# Patient Record
Sex: Female | Born: 2000 | Race: Asian | Marital: Single | Smoking: Never smoker
Health system: Southern US, Community
[De-identification: ages and names within clinical notes are randomized; demographics above are authoritative.]

## PROBLEM LIST (undated history)

## (undated) DIAGNOSIS — F419 Anxiety disorder, unspecified: Secondary | ICD-10-CM

## (undated) DIAGNOSIS — N911 Secondary amenorrhea: Secondary | ICD-10-CM

## (undated) DIAGNOSIS — E039 Hypothyroidism, unspecified: Secondary | ICD-10-CM

## (undated) HISTORY — DX: Hypothyroidism, unspecified: E03.9

## (undated) HISTORY — PX: WISDOM TOOTH EXTRACTION: SHX21

## (undated) HISTORY — DX: Anxiety disorder, unspecified: F41.9

## (undated) HISTORY — DX: Secondary amenorrhea: N91.1

---
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
## 2019-03-18 DIAGNOSIS — Z20828 Contact with and (suspected) exposure to other viral communicable diseases: Secondary | ICD-10-CM | POA: Diagnosis not present

## 2019-05-10 DIAGNOSIS — F411 Generalized anxiety disorder: Secondary | ICD-10-CM | POA: Diagnosis not present

## 2019-05-14 DIAGNOSIS — F411 Generalized anxiety disorder: Secondary | ICD-10-CM | POA: Diagnosis not present

## 2019-05-28 DIAGNOSIS — F411 Generalized anxiety disorder: Secondary | ICD-10-CM | POA: Diagnosis not present

## 2019-05-30 ENCOUNTER — Telehealth: Payer: Self-pay

## 2019-05-30 ENCOUNTER — Other Ambulatory Visit: Payer: Self-pay | Admitting: Family

## 2019-05-30 DIAGNOSIS — F4322 Adjustment disorder with anxiety: Secondary | ICD-10-CM

## 2019-05-30 MED ORDER — ESCITALOPRAM OXALATE 10 MG PO TABS: 10.0000 mg | ORAL_TABLET | Freq: Every day | ORAL | 2 refills | Status: DC

## 2019-05-30 NOTE — Telephone Encounter (Signed)
Mother called asking for refill of Lexapro and synthroid to be sent to [facility]. She asks for 3 months supply to be sent to pharmacy.

## 2019-05-31 ENCOUNTER — Other Ambulatory Visit: Payer: Self-pay | Admitting: Family

## 2019-05-31 DIAGNOSIS — E039 Hypothyroidism, unspecified: Secondary | ICD-10-CM

## 2019-05-31 MED ORDER — LEVOTHYROXINE SODIUM 75 MCG PO TABS: ORAL_TABLET | ORAL | 0 refills | Status: DC

## 2019-05-31 NOTE — Telephone Encounter (Signed)
She also asks for Synthroid to be refilled as well.

## 2019-06-05 DIAGNOSIS — F411 Generalized anxiety disorder: Secondary | ICD-10-CM | POA: Diagnosis not present

## 2019-06-05 NOTE — Telephone Encounter (Signed)
Called and left vm stating medication was sent to pharmacy and pt needs follow up. Asked to call office back to schedule virtual visit.

## 2019-06-10 DIAGNOSIS — Z20828 Contact with and (suspected) exposure to other viral communicable diseases: Secondary | ICD-10-CM | POA: Diagnosis not present

## 2019-06-12 DIAGNOSIS — F411 Generalized anxiety disorder: Secondary | ICD-10-CM | POA: Diagnosis not present

## 2019-06-21 DIAGNOSIS — Z20828 Contact with and (suspected) exposure to other viral communicable diseases: Secondary | ICD-10-CM | POA: Diagnosis not present

## 2019-07-17 DIAGNOSIS — F411 Generalized anxiety disorder: Secondary | ICD-10-CM | POA: Diagnosis not present

## 2019-08-27 DIAGNOSIS — Z20828 Contact with and (suspected) exposure to other viral communicable diseases: Secondary | ICD-10-CM | POA: Diagnosis not present

## 2019-09-27 ENCOUNTER — Ambulatory Visit: Payer: PRIVATE HEALTH INSURANCE | Attending: Internal Medicine

## 2019-09-27 DIAGNOSIS — Z20828 Contact with and (suspected) exposure to other viral communicable diseases: Secondary | ICD-10-CM | POA: Diagnosis not present

## 2019-09-27 DIAGNOSIS — Z20822 Contact with and (suspected) exposure to covid-19: Secondary | ICD-10-CM

## 2019-09-28 DIAGNOSIS — E559 Vitamin D deficiency, unspecified: Secondary | ICD-10-CM

## 2019-09-28 HISTORY — DX: Vitamin D deficiency, unspecified: E55.9

## 2019-09-29 LAB — NOVEL CORONAVIRUS, NAA: SARS-CoV-2, NAA: NOT DETECTED

## 2019-11-19 ENCOUNTER — Other Ambulatory Visit: Payer: Self-pay | Admitting: Pediatrics

## 2019-11-19 DIAGNOSIS — E039 Hypothyroidism, unspecified: Secondary | ICD-10-CM

## 2019-11-26 ENCOUNTER — Ambulatory Visit: Payer: PRIVATE HEALTH INSURANCE | Admitting: Pediatrics

## 2019-12-21 ENCOUNTER — Telehealth: Payer: Self-pay | Admitting: Pediatrics

## 2019-12-21 NOTE — Telephone Encounter (Signed)
Pre-screening for onsite visit  Patient  Informed only one adult can bring patient to the visit to limit possible exposure to COVID19 and facemasks must be worn while in the building by the patient (ages 2 and older) and adult.  2. Has the person bringing the patient or the patient been around anyone with suspected or confirmed COVID-19 in the last 14 days? No  3. Has the person bringing the patient or the patient been around anyone who has been tested for COVID-19 in the last 14 days? No  4. Has the person bringing the patient or the patient had any of these symptoms in the last 14 days? No  Fever (temp 100 F or higher) Breathing problems Cough Sore throat Body aches Chills Vomiting Diarrhea Loss of taste or smell   If all answers are negative, advise patient to call our office prior to your appointment if you or the patient develop any of the symptoms listed above.   If any answers are yes, cancel in-office visit and schedule the patient for a same day telehealth visit with a provider to discuss the next steps.

## 2019-12-24 ENCOUNTER — Other Ambulatory Visit: Payer: Self-pay

## 2019-12-24 ENCOUNTER — Ambulatory Visit (INDEPENDENT_AMBULATORY_CARE_PROVIDER_SITE_OTHER): Payer: PRIVATE HEALTH INSURANCE | Admitting: Pediatrics

## 2019-12-24 ENCOUNTER — Encounter: Payer: Self-pay | Admitting: Pediatrics

## 2019-12-24 VITALS — BP 119/67 | HR 81 | Ht 62.5 in | Wt 142.0 lb

## 2019-12-24 DIAGNOSIS — F5001 Anorexia nervosa, restricting type: Secondary | ICD-10-CM | POA: Diagnosis not present

## 2019-12-24 DIAGNOSIS — R5383 Other fatigue: Secondary | ICD-10-CM

## 2019-12-24 DIAGNOSIS — F50019 Anorexia nervosa, restricting type, unspecified: Secondary | ICD-10-CM

## 2019-12-24 DIAGNOSIS — E039 Hypothyroidism, unspecified: Secondary | ICD-10-CM | POA: Diagnosis not present

## 2019-12-24 DIAGNOSIS — F4322 Adjustment disorder with anxiety: Secondary | ICD-10-CM

## 2019-12-24 MED ORDER — LEVOTHYROXINE SODIUM 50 MCG PO TABS: 50.0000 ug | ORAL_TABLET | Freq: Every day | ORAL | 1 refills | Status: DC

## 2019-12-24 NOTE — Patient Instructions (Signed)
Synthroid 50 mcg daily  Labs today

## 2019-12-24 NOTE — Progress Notes (Signed)
History was provided by the patient.  Mary Ellis is a 19 y.o. female who is here for fatigue, excessive sleeping.  Patsi Sears, MD (Inactive)   HPI:  Pt reports that she had CVOID in the beginning of February. Feels like fatigue was bad before that, doesn't seem to have worsened. Smell hasn't returned totally. She is sleeping during the day and then needing to go back to bed early in the evening.   She goes [place] lots of walks in [place].   Food intake is good.   Lexapro continues to go well. Denies issues with anxiety or depression symptoms except for fatigue, which is likely r/t her thyroid.   Took ocp 1/2 freshman year and then stopped. Not sexually active. Never been. Menstrual cycles are pretty normal. They usually last about 4 days, but last one in March was about 10 days. Denies hair loss.   Doesn't eat red meat, otherwise diet is really varied.    No LMP recorded.  Review of Systems  Constitutional: Positive for malaise/fatigue.  Eyes: Negative for double vision.  Respiratory: Negative for shortness of breath.   Cardiovascular: Negative for chest pain and palpitations.  Gastrointestinal: Negative for abdominal pain, constipation, diarrhea, nausea and vomiting.  Genitourinary: Negative for dysuria.  Musculoskeletal: Negative for joint pain and myalgias.  Skin: Negative for rash.  Neurological: Negative for dizziness and headaches.  Endo/Heme/Allergies: Does not bruise/bleed easily.  Psychiatric/Behavioral: Negative for depression. The patient is not nervous/anxious and does not have insomnia.     Patient Active Problem List   Diagnosis Date Noted  . Adjustment disorder with anxiety 12/01/2016  . Esophageal reflux 07/06/2016  . Constipation 06/29/2016  . Hypothyroidism 06/18/2016  . Disordered eating 06/14/2016  . Amenorrhea, secondary 06/14/2016    Current Outpatient Medications on File Prior to Visit  Medication Sig Dispense Refill  . escitalopram (LEXAPRO) 10  MG tablet Take 1 tablet (10 mg total) by mouth daily. 90 tablet 2  . levothyroxine (SYNTHROID) 75 MCG tablet TAKE 1/2 TABLET BY MOUTH EVERY DAY BEFORE BREAKFAST 90 tablet 0  . Multiple Vitamin (MULTI-VITAMIN PO) Take by mouth.    . omega-3 acid ethyl esters (LOVAZA) 1 g capsule Take by mouth 2 (two) times daily.    . Probiotic Product (PROBIOTIC PO) Take 1 tablet by mouth daily.     No current facility-administered medications on file prior to visit.    Allergies  Allergen Reactions  . Lactose Intolerance (Gi)   . Pineapple Hives    Social History: Confidentiality was discussed with the patient and if applicable, with caregiver as well. Tobacco: none Secondhand smoke exposure? no Drugs/EtOH: none Sexually active? no  Safety: safe to self and at school Pregnancy Prevention: condoms  Physical Exam:    Vitals:   12/24/19 1510  BP: 119/67  Pulse: 81  Weight: 142 lb (64.4 kg)  Height: 5' 2.5" (1.588 m)    Blood pressure percentiles are not available for patients who are 18 years or older.  Physical Exam Vitals and nursing note reviewed.  Constitutional:      General: She is not in acute distress.    Appearance: She is well-developed.  Neck:     Thyroid: No thyroid mass, thyromegaly or thyroid tenderness.  Cardiovascular:     Rate and Rhythm: Normal rate and regular rhythm.     Heart sounds: No murmur.  Pulmonary:     Breath sounds: Normal breath sounds.  Abdominal:     Palpations: Abdomen is soft. There  is no mass.     Tenderness: There is no abdominal tenderness. There is no guarding.  Musculoskeletal:     Right lower leg: No edema.     Left lower leg: No edema.  Lymphadenopathy:     Cervical: No cervical adenopathy.  Skin:    General: Skin is warm.     Findings: No rash.  Neurological:     Mental Status: She is alert.     Comments: No tremor     Assessment/Plan: 1. Hypothyroidism, unspecified type TSH on labs at [facility] was 4.97. we reviewed these  together from her mychart. We will increase synthroid dose to 50 mcg daily and repeat labs in 6 weeks there. She will send me results.  - levothyroxine (SYNTHROID) 50 MCG tablet; Take 1 tablet (50 mcg total) by mouth daily.  Dispense: 90 tablet; Refill: 1 - Thyroid Peroxidase Antibodies (TPO) (REFL) - TSH + free T4  2. Adjustment disorder with anxiety Stable on lexapro.   3. Anorexia nervosa, restricting type In remission.   4. Fatigue, unspecified type Will ensure she doesn't have any vit deficiencies contributing to fatigue.  - Vitamin D (25 hydroxy) - B12 - Ferritin   Alfonso Ramus, FNP

## 2019-12-25 ENCOUNTER — Other Ambulatory Visit: Payer: Self-pay | Admitting: Pediatrics

## 2019-12-25 DIAGNOSIS — E559 Vitamin D deficiency, unspecified: Secondary | ICD-10-CM

## 2019-12-25 LAB — VITAMIN D 25 HYDROXY (VIT D DEFICIENCY, FRACTURES): Vit D, 25-Hydroxy: 11 ng/mL — ABNORMAL LOW (ref 30–100)

## 2019-12-25 LAB — THYROID PEROXIDASE ANTIBODIES (TPO) (REFL): Thyroperoxidase Ab SerPl-aCnc: <1 [IU]/mL

## 2019-12-25 LAB — FERRITIN: Ferritin: 51 ng/mL (ref 16–154)

## 2019-12-25 LAB — VITAMIN B12: Vitamin B-12: 576 pg/mL (ref 200–1100)

## 2019-12-25 MED ORDER — VITAMIN D (ERGOCALCIFEROL) 1.25 MG (50000 UNIT) PO CAPS: 50000.0000 [IU] | ORAL_CAPSULE | ORAL | 0 refills | Status: DC

## 2019-12-27 ENCOUNTER — Ambulatory Visit: Payer: PRIVATE HEALTH INSURANCE | Admitting: Family

## 2020-02-11 ENCOUNTER — Other Ambulatory Visit: Payer: Self-pay | Admitting: Family

## 2020-02-11 DIAGNOSIS — F4322 Adjustment disorder with anxiety: Secondary | ICD-10-CM

## 2020-03-25 ENCOUNTER — Other Ambulatory Visit: Payer: Self-pay | Admitting: Pediatrics

## 2020-03-25 DIAGNOSIS — E559 Vitamin D deficiency, unspecified: Secondary | ICD-10-CM

## 2020-05-03 ENCOUNTER — Other Ambulatory Visit: Payer: Self-pay | Admitting: Pediatrics

## 2020-05-03 ENCOUNTER — Other Ambulatory Visit: Payer: Self-pay | Admitting: Family

## 2020-05-03 DIAGNOSIS — E039 Hypothyroidism, unspecified: Secondary | ICD-10-CM

## 2020-05-03 DIAGNOSIS — F4322 Adjustment disorder with anxiety: Secondary | ICD-10-CM

## 2020-05-05 ENCOUNTER — Other Ambulatory Visit: Payer: Self-pay | Admitting: Pediatrics

## 2020-05-05 DIAGNOSIS — E039 Hypothyroidism, unspecified: Secondary | ICD-10-CM

## 2020-05-05 MED ORDER — LEVOTHYROXINE SODIUM 50 MCG PO TABS: 50.0000 ug | ORAL_TABLET | Freq: Every day | ORAL | 1 refills | Status: DC

## 2020-05-08 ENCOUNTER — Ambulatory Visit (INDEPENDENT_AMBULATORY_CARE_PROVIDER_SITE_OTHER): Payer: BC Managed Care – PPO | Admitting: Pediatrics

## 2020-05-08 DIAGNOSIS — E039 Hypothyroidism, unspecified: Secondary | ICD-10-CM

## 2020-05-08 DIAGNOSIS — R7989 Other specified abnormal findings of blood chemistry: Secondary | ICD-10-CM | POA: Diagnosis not present

## 2020-05-08 LAB — VITAMIN D 25 HYDROXY (VIT D DEFICIENCY, FRACTURES): Vit D, 25-Hydroxy: 44 ng/mL (ref 30–100)

## 2020-05-08 LAB — TSH: TSH: 2.16 mIU/L

## 2020-05-08 LAB — T4, FREE: Free T4: 1.1 ng/dL (ref 0.8–1.4)

## 2020-05-08 NOTE — Progress Notes (Signed)
In for labs. Blood collected without incident. She is doing well although feeling somewhat more fatigued recently. TFTs and vit D drawn.   Alfonso Ramus, FNP

## 2020-06-26 ENCOUNTER — Other Ambulatory Visit: Payer: Self-pay | Admitting: Pediatrics

## 2020-06-26 DIAGNOSIS — E039 Hypothyroidism, unspecified: Secondary | ICD-10-CM

## 2020-06-26 MED ORDER — LEVOTHYROXINE SODIUM 50 MCG PO TABS: 50.0000 ug | ORAL_TABLET | Freq: Every day | ORAL | 1 refills | Status: DC

## 2020-07-28 ENCOUNTER — Other Ambulatory Visit: Payer: Self-pay | Admitting: Family

## 2020-07-28 DIAGNOSIS — F4322 Adjustment disorder with anxiety: Secondary | ICD-10-CM

## 2020-09-29 ENCOUNTER — Other Ambulatory Visit: Payer: Self-pay | Admitting: Family

## 2020-09-29 DIAGNOSIS — F4322 Adjustment disorder with anxiety: Secondary | ICD-10-CM

## 2020-10-21 ENCOUNTER — Other Ambulatory Visit: Payer: Self-pay | Admitting: Pediatrics

## 2020-10-21 ENCOUNTER — Telehealth: Payer: Self-pay

## 2020-10-21 DIAGNOSIS — E039 Hypothyroidism, unspecified: Secondary | ICD-10-CM

## 2020-10-21 NOTE — Telephone Encounter (Signed)
Faxed over to clinic.

## 2020-10-21 NOTE — Telephone Encounter (Signed)
-----   Message from Verneda Skill, FNP sent at 10/21/2020 11:55 AM EST ----- Regarding: FW: Labs I will place orders, can you fax  ----- Message ----- From: Irish Lack, MD Sent: 10/21/2020  11:35 AM EST To: Verneda Skill, FNP Subject: Labs                                           Hi Francisca December was wondering if you could fax in a TSH order to [facility]. She is feeling very fatigued and thinks her Synthroid dose may need to be increased. Their fax is [phone] and main phone is [phone].  Thank you so much! Irish Lack

## 2020-10-30 ENCOUNTER — Encounter: Payer: Self-pay | Admitting: Pediatrics

## 2020-10-31 ENCOUNTER — Other Ambulatory Visit: Payer: Self-pay | Admitting: Pediatrics

## 2020-10-31 MED ORDER — LEVOTHYROXINE SODIUM 75 MCG PO TABS: 75.0000 ug | ORAL_TABLET | Freq: Every day | ORAL | 1 refills | Status: DC

## 2021-04-12 DIAGNOSIS — E559 Vitamin D deficiency, unspecified: Secondary | ICD-10-CM

## 2021-04-12 DIAGNOSIS — R5383 Other fatigue: Secondary | ICD-10-CM

## 2021-04-12 DIAGNOSIS — E039 Hypothyroidism, unspecified: Secondary | ICD-10-CM

## 2021-04-13 ENCOUNTER — Other Ambulatory Visit: Payer: Self-pay | Admitting: Family

## 2021-04-13 ENCOUNTER — Other Ambulatory Visit: Payer: Self-pay | Admitting: Pediatrics

## 2021-04-13 DIAGNOSIS — F4322 Adjustment disorder with anxiety: Secondary | ICD-10-CM

## 2021-04-13 MED ORDER — ESCITALOPRAM OXALATE 10 MG PO TABS: 10.0000 mg | ORAL_TABLET | Freq: Every day | ORAL | 0 refills | Status: DC

## 2021-04-13 MED ORDER — NORGESTIMATE-ETH ESTRADIOL 0.25-35 MG-MCG PO TABS: 1.0000 | ORAL_TABLET | Freq: Every day | ORAL | 3 refills | Status: DC

## 2021-04-21 ENCOUNTER — Other Ambulatory Visit: Payer: Self-pay | Admitting: Pediatrics

## 2021-04-21 DIAGNOSIS — E559 Vitamin D deficiency, unspecified: Secondary | ICD-10-CM

## 2021-04-21 DIAGNOSIS — R5383 Other fatigue: Secondary | ICD-10-CM

## 2021-04-21 DIAGNOSIS — E039 Hypothyroidism, unspecified: Secondary | ICD-10-CM

## 2021-04-27 ENCOUNTER — Other Ambulatory Visit: Payer: Self-pay | Admitting: Pediatrics

## 2021-04-27 MED ORDER — LEVOTHYROXINE SODIUM 88 MCG PO TABS: 88.0000 ug | ORAL_TABLET | Freq: Every day | ORAL | 1 refills | Status: DC

## 2021-05-27 ENCOUNTER — Other Ambulatory Visit: Payer: Self-pay | Admitting: Pediatrics

## 2021-05-27 MED ORDER — ETHYNODIOL DIAC-ETH ESTRADIOL 1-35 MG-MCG PO TABS: 1.0000 | ORAL_TABLET | Freq: Every day | ORAL | 3 refills | Status: DC

## 2021-06-11 ENCOUNTER — Other Ambulatory Visit: Payer: Self-pay | Admitting: Pediatrics

## 2021-06-11 DIAGNOSIS — F4322 Adjustment disorder with anxiety: Secondary | ICD-10-CM

## 2021-06-15 ENCOUNTER — Other Ambulatory Visit: Payer: Self-pay

## 2021-06-15 DIAGNOSIS — E039 Hypothyroidism, unspecified: Secondary | ICD-10-CM

## 2021-06-19 ENCOUNTER — Ambulatory Visit: Payer: BC Managed Care – PPO

## 2021-06-19 DIAGNOSIS — E039 Hypothyroidism, unspecified: Secondary | ICD-10-CM | POA: Diagnosis not present

## 2021-06-19 LAB — TSH+FREE T4: TSH W/REFLEX TO FT4: 1.23 mIU/L

## 2021-11-14 ENCOUNTER — Other Ambulatory Visit: Payer: Self-pay | Admitting: Pediatrics

## 2021-12-31 ENCOUNTER — Ambulatory Visit (INDEPENDENT_AMBULATORY_CARE_PROVIDER_SITE_OTHER): Payer: BC Managed Care – PPO | Admitting: Pediatrics

## 2021-12-31 ENCOUNTER — Other Ambulatory Visit (HOSPITAL_COMMUNITY)
Admission: RE | Admit: 2021-12-31 | Discharge: 2021-12-31 | Disposition: A | Discharge status: Home / Self Care | Payer: BC Managed Care – PPO | Source: Ambulatory Visit | Attending: Pediatrics | Admitting: Pediatrics

## 2021-12-31 ENCOUNTER — Encounter: Payer: Self-pay | Admitting: Pediatrics

## 2021-12-31 VITALS — BP 113/74 | HR 82 | Ht 62.6 in | Wt 131.2 lb

## 2021-12-31 DIAGNOSIS — Z113 Encounter for screening for infections with a predominantly sexual mode of transmission: Secondary | ICD-10-CM

## 2021-12-31 DIAGNOSIS — F4322 Adjustment disorder with anxiety: Secondary | ICD-10-CM | POA: Diagnosis not present

## 2021-12-31 DIAGNOSIS — K219 Gastro-esophageal reflux disease without esophagitis: Secondary | ICD-10-CM

## 2021-12-31 DIAGNOSIS — E039 Hypothyroidism, unspecified: Secondary | ICD-10-CM | POA: Diagnosis not present

## 2021-12-31 DIAGNOSIS — R140 Abdominal distension (gaseous): Secondary | ICD-10-CM | POA: Diagnosis not present

## 2021-12-31 DIAGNOSIS — Z3202 Encounter for pregnancy test, result negative: Secondary | ICD-10-CM

## 2021-12-31 DIAGNOSIS — E559 Vitamin D deficiency, unspecified: Secondary | ICD-10-CM | POA: Diagnosis not present

## 2021-12-31 LAB — T3: T3, Total: 111 ng/dL (ref 76–181)

## 2021-12-31 LAB — T4, FREE: Free T4: 1.6 ng/dL (ref 0.8–1.8)

## 2021-12-31 LAB — VITAMIN D 25 HYDROXY (VIT D DEFICIENCY, FRACTURES): Vit D, 25-Hydroxy: 35 ng/mL (ref 30–100)

## 2021-12-31 LAB — TSH: TSH: 1.93 mIU/L

## 2021-12-31 MED ORDER — FAMOTIDINE 40 MG PO TABS: 40.0000 mg | ORAL_TABLET | Freq: Every day | ORAL | 2 refills | Status: DC

## 2021-12-31 NOTE — Patient Instructions (Signed)
Start probiotics for your GI tract  ?Famotidone for reflux  ?Labs today  ?Work on more exercise  ? ?

## 2021-12-31 NOTE — Progress Notes (Signed)
History was provided by the patient. ? ?Mary Ellis is a 21 y.o. female who is here for adjustment disorder, hypothyroidism.  ?Pcp, No  ? ?HPI:  Pt reports she is graduating this year. 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  ? ?Has been really tired lately. Sleeps 12-14 hours a day- says she never goes outside. Not a lot of motivation to do anything. Doesn't really feel sad or anxious. Does wonder if more depressed. Started more at the end of last semester and feels like it is worsening. Taking lexapro regularly. She is sleeping through the night.  ? ?Eating is very normal. Not getting any physical activity. Would like to start going back to yoga- thinks one of her friends would be willing to go with her to help hold her accountable.  ? ?Not having any constipation, diarrhea, cold intolerance. Is having some ongoing reflux that has been present since she was a child. Also having some bloating after some meals for the past 2-3 years. Has needed a lot of antibiotics for strep in the last year as well.  ? ? ?  12/31/2021  ? 11:17 AM 12/20/2017  ?  3:42 PM 12/20/2017  ?  3:41 PM  ?PHQ-SADS Last 3 Score only  ?PHQ-15 Score 9 4   ?Total GAD-7 Score 1  3  ?PHQ Adolescent Score 9  3  ?  ? ? ?No LMP recorded. ? ?ROS ? ?Patient Active Problem List  ? Diagnosis Date Noted  ? Adjustment disorder with anxiety 12/01/2016  ? Esophageal reflux 07/06/2016  ? Constipation 06/29/2016  ? Hypothyroidism 06/18/2016  ? Disordered eating 06/14/2016  ? Amenorrhea, secondary 06/14/2016  ? ? ?Current Outpatient Medications on File Prior to Visit  ?Medication Sig Dispense Refill  ? escitalopram (LEXAPRO) 10 MG tablet TAKE 1 TABLET BY MOUTH EVERY DAY 90 tablet 1  ? levothyroxine (SYNTHROID) 88 MCG tablet TAKE 1 TABLET BY MOUTH EVERY DAY 30 tablet 5  ? Vitamin D, Ergocalciferol, (DRISDOL) 1.25 MG (50000 UNIT) CAPS capsule TAKE 1 CAPSULE (50,000 UNITS TOTAL) BY MOUTH EVERY 7 (SEVEN) DAYS. 4 capsule 2  ? norgestimate-ethinyl  estradiol (ORTHO-CYCLEN) 0.25-35 MG-MCG tablet Take 1 tablet by mouth daily.    ? ?No current facility-administered medications on file prior to visit.  ? ? ?Allergies  ?Allergen Reactions  ? Lactose Intolerance (Gi)   ? Pineapple Hives  ? ? ?Social History: ?Confidentiality was discussed with the patient and if applicable, with caregiver as well. ?Tobacco: vape socially  ?Secondhand smoke exposure? no ?Drugs/EtOH: drinking socially  ?Sexually active? yes - using condoms sometimes. Taking ocp every day. Having periods once a month. Last period was more like 10 days but not a heavy flow.   ?Safety: yes ? ?Physical Exam:  ?  ?Vitals:  ? 12/31/21 1035  ?BP: 113/74  ?Pulse: 82  ?Weight: 131 lb 3.2 oz (59.5 kg)  ?Height: 5' 2.6" (1.59 m)  ? ? ?Growth percentile SmartLinks can only be used for patients less than 10 years old. ? ?Physical Exam ?Vitals and nursing note reviewed.  ?Constitutional:   ?   General: She is not in acute distress. ?   Appearance: She is well-developed.  ?Neck:  ?   Thyroid: No thyromegaly.  ?Cardiovascular:  ?   Rate and Rhythm: Normal rate and regular rhythm.  ?   Heart sounds: No murmur heard. ?Pulmonary:  ?   Breath sounds: Normal breath sounds.  ?Abdominal:  ?   Palpations: Abdomen is soft.  There is no mass.  ?   Tenderness: There is no abdominal tenderness. There is no guarding.  ?Musculoskeletal:  ?   Right lower leg: No edema.  ?   Left lower leg: No edema.  ?Lymphadenopathy:  ?   Cervical: No cervical adenopathy.  ?Skin: ?   General: Skin is warm.  ?   Capillary Refill: Capillary refill takes less than 2 seconds.  ?   Findings: No rash.  ?Neurological:  ?   General: No focal deficit present.  ?   Mental Status: She is alert.  ?   Comments: No tremor  ?Psychiatric:     ?   Mood and Affect: Mood normal.  ? ? ?Assessment/Plan: ?1. Hypothyroidism, unspecified type ?Repeat labs today. Has been taking synthroid 88 mcg daily and not missing doses.  ?- TSH ?- T4, free ?- T3 ? ?2. Adjustment  disorder with anxiety ?Not super anxious but definitely having an uptick in what may be depression type symtpoms. Will check thyroid today, discussed management options including increase in lexapro, addition of wellbutrin or trying exercise first. We will decide after labs.  ? ?3. Gastroesophageal reflux disease, unspecified whether esophagitis present ?Start pepcid as needed  ?- famotidine (PEPCID) 40 MG tablet; Take 1 tablet (40 mg total) by mouth daily.  Dispense: 30 tablet; Refill: 2 ? ?4. Abdominal bloating ?Start probiotic daily, monitor what foods may be contributing. ? ?5. Vitamin D deficiency ?Recheck today, takes supplement intermittently.  ?- VITAMIN D 25 Hydroxy (Vit-D Deficiency, Fractures) ? ?Return pending labs, as needed  ? ?Jonathon Resides, FNP ? ? ?

## 2021-12-31 NOTE — Addendum Note (Signed)
Addended by: Ardeth Sportsman on: 12/31/2021 11:43 AM ? ? Modules accepted: Orders ? ?

## 2022-01-01 LAB — URINE CYTOLOGY ANCILLARY ONLY
Bacterial Vaginitis-Urine: NEGATIVE
Candida Urine: NEGATIVE
Chlamydia: NEGATIVE
Comment: NEGATIVE
Comment: NEGATIVE
Comment: NORMAL
Neisseria Gonorrhea: NEGATIVE
Trichomonas: NEGATIVE

## 2022-03-10 ENCOUNTER — Other Ambulatory Visit: Payer: Self-pay | Admitting: Pediatrics

## 2022-03-10 MED ORDER — NORGESTIMATE-ETH ESTRADIOL 0.25-35 MG-MCG PO TABS: 1.0000 | ORAL_TABLET | Freq: Every day | ORAL | 3 refills | Status: DC

## 2022-03-18 ENCOUNTER — Other Ambulatory Visit: Payer: Self-pay | Admitting: Family

## 2022-03-18 DIAGNOSIS — F4322 Adjustment disorder with anxiety: Secondary | ICD-10-CM

## 2022-06-20 ENCOUNTER — Other Ambulatory Visit: Payer: Self-pay | Admitting: Family

## 2022-06-20 DIAGNOSIS — F4322 Adjustment disorder with anxiety: Secondary | ICD-10-CM

## 2022-06-25 ENCOUNTER — Other Ambulatory Visit: Payer: Self-pay | Admitting: Family Medicine

## 2022-06-25 MED ORDER — LEVOTHYROXINE SODIUM 88 MCG PO TABS: 88.0000 ug | ORAL_TABLET | Freq: Every day | ORAL | 0 refills | Status: DC

## 2022-06-25 NOTE — Progress Notes (Signed)
Received message from mother, asking if levothyroxine could be refilled for a month, to last until her visit.  Confirmed that she takes generic, rx sent.

## 2022-07-13 ENCOUNTER — Encounter: Payer: Self-pay | Admitting: Family Medicine

## 2022-07-13 DIAGNOSIS — E559 Vitamin D deficiency, unspecified: Secondary | ICD-10-CM | POA: Insufficient documentation

## 2022-07-13 NOTE — Progress Notes (Signed)
Chief Complaint  Patient presents with   Annual Exam    Fasting annual exam with pap. Sees eye doctor regularly. Has severe bloating and abdominal pain after she eats, has been going on as long as she can remember. She does try to eat less gluten and dairy which helps some.    Mary Ellis is a 21 y.o. female who presents for a complete physical.  She is a new patient, having previously seen only pediatrician. Last seen in 12/2021. She is due for bloodwork for her thyroid, and needing medication refills  She has the following concerns: Bloating and GI issues. With "everything I eat" she feels bloated, "like 9 months pregnant". She gets reflux if overeats. She reports these symptoms have been ongoing since childhood, no dramatic changes. Limits gluten and dairy, which helps. Smaller portions also help  She is in a sexual relationship, on OCP's, not using condoms. Going through a breakup currently.  Not handling it well, but has a lot of supportive friends, family and coworkers. (Related to being long distance relationship; denies concerns about infidelity).  She is on OCP's.  She had these refilled by prior provider in 02/2022 for a year. She has never had a pap smear. Menses are regular. She denies vaginal discharge, odor, itch. # lifetime sexual partners: 7 Had STD testing at [facility] 1.5 years ago. She had a vaginal laceration related to intercourse the spring of her junior year (related to dryness/lack of foreplay.  Denies abuse/rape, was consensual. She recalls being told she lost a lot of blood due to the laceration. She hasn't had any further GYN issues.  Anxiety:  She was started on Lexapro back in 2018 (related to significant anxiety and ongoing binge eating, after hospitalization for eating d/o). She has remained on 10mg  lexapro, and doing well. Denies anxiety since HS. She had a lot of stress senior year of college. More recently has had some depression, rather than  anxiety. At April PCP visit she had some depressive symptoms. She had lack of motivation, hadn't been exercising (stressful senior year). She reports that her moods improved with move, job, routine. Feeling down recently just related to relationship breakup. She has supportive friends/family Enjoys painting, would like to do more  GERD:  She was prescribed famotidine 40mg  in April. Doesn't take it.  Reflux has improved.  Vitamin D deficiency: level was low at 11 in 11/2019.  Last checked in April, level was 35 when taking supplement only intermittently. She has not been taking vitamin D recently, but had a lot of sun exposure over the summer.  Hypothyroidism:  She reports compliance with taking levothyroxine 24mcg daily. She takes this along with her OCP and lexapro, as she has always done. Takes 30 minutes before eating. She denies changes to hair/skin/nails/bowel/energy/moods (other than related to breakup).  Lab Results  Component Value Date   TSH 1.93 12/31/2021   H/o eating disorder--age 35, anorexia, hospitalized 05/2016 with 30# weight loss, bradycardia. No issues since. Comfortable with her weight, body image.   Immunization History  Administered Date(s) Administered   DTaP 05/09/2000, 07/11/2000, 09/05/2000, 06/01/2001, 04/08/2005   H1N1 08/02/2008, 08/30/2008   HIB (PRP-T) 05/09/2000, 07/11/2000, 09/05/2000, 06/01/2001   HPV Quadrivalent 08/16/2011, 10/27/2011, 04/27/2012   Hepatitis A, Ped/Adol-2 Dose 04/08/2005, 10/14/2005   Hepatitis B 12-Dec-1999, 04/07/2000, 12/05/2000   IPV 05/09/2000, 07/11/2000, 04/06/2001, 04/08/2005   Influenza Inj Mdck Quad Pf 07/30/2017   Influenza Nasal 07/16/2005, 09/11/2006, 09/07/2007, 07/17/2009, 07/28/2010, 08/16/2011   Influenza Split 09/10/2004  Influenza, Seasonal, Injecte, Preservative Fre 08/02/2008   Influenza,Quad,Nasal, Live 07/22/2012, 07/07/2013, 08/18/2014   Influenza,inj,Quad PF,6+ Mos 08/01/2015, 06/16/2016, 06/19/2019,  09/12/2020   Influenza-Unspecified 06/19/2021   MMR 04/06/2001, 04/08/2005   Meningococcal B, OMV 05/13/2016, 05/18/2017   Meningococcal Conjugate 08/16/2011, 05/13/2016   PFIZER(Purple Top)SARS-COV-2 Vaccination 12/22/2019, 08/20/2020   Pfizer Covid-19 Vaccine Bivalent Booster 22yrs & up 06/19/2021   Pneumococcal Conjugate PCV 7 05/09/2000, 07/11/2000, 09/05/2000   Tdap 04/14/2011   Varicella 04/06/2001, 05/12/2006   Last Pap smear: never Last mammogram: never Last colonoscopy: never Last DEXA: never Dentist: regular Ophtho: regular Exercise: Walks, no other exercise  Had negative GC/chlamydia screen 12/2021 Vitamin D-OH 35 in 12/2021  Past Medical History:  Diagnosis Date   Anxiety    Eating disorder 05/2016   hospitalized age 49; anorexia nervosa   Hypothyroidism    Secondary amenorrhea    related to eating disorder   Vitamin D deficiency 2021   Past Surgical History:  Procedure Laterality Date   WISDOM TOOTH EXTRACTION     Social History   Social History Narrative   Attended [employer].   Graduated from [employer], minor studio arts--painting)   Parents are physicians in [place].      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 [place]      Updated 06/2022   Social History   Tobacco Use   Smoking status: Never   Smokeless tobacco: Never  Vaping Use   Vaping Use: Some days   Substances: Nicotine  Substance Use Topics   Alcohol use: Yes    Comment: max 10/week, typical is 2 drinks one night, and 7-8 once/week   Drug use: Never   Family History  Problem Relation Age of Onset   Allergies Mother    Uveitis Mother    Hypothyroidism Mother    Allergies Father    Hypothyroidism Father    Hypertension Father    Hyperlipidemia Father    Hypothyroidism Brother    Allergies Brother    Hypothyroidism Maternal Grandmother    Leukemia Maternal Grandmother    Crohn's disease Maternal Grandmother    Hypothyroidism  Paternal Grandmother    Pancreatic cancer Paternal Grandfather     ROS:  The patient denies fever, weight changes, headaches, vision changes, decreased hearing, ear pain, sore throat, breast concerns, chest pain, palpitations, dizziness, syncope, dyspnea on exertion, cough, swelling, nausea, vomiting, diarrhea, constipation, melena, hematochezia, hematuria, incontinence, dysuria, irregular menstrual cycles, vaginal discharge, odor or itch, genital lesions, joint pains, numbness, tingling, weakness, tremor, suspicious skin lesions, abnormal bleeding/bruising, or enlarged lymph nodes. Bloating after eating, per HPI (better if limits dairy, gluten). No hematochezia, mucus, melena, no diarrhea. GERD improved with eating smaller meals. Moods per HPI, slightly down with recent breakup    PHYSICAL EXAM:  BP 110/72   Pulse 68   Ht $R'5\' 4"'xn$  (1.626 m)   Wt 142 lb 3.2 oz (64.5 kg)   LMP 06/29/2022   BMI 24.41 kg/m   Wt Readings from Last 3 Encounters:  07/15/22 142 lb 3.2 oz (64.5 kg)  12/31/21 131 lb 3.2 oz (59.5 kg)  12/24/19 142 lb (64.4 kg) (72 %, Z= 0.57)*   * Growth percentiles are based on CDC (Girls, 2-20 Years) data.   BP 110/72   Pulse 68   Ht $R'5\' 4"'MS$  (1.626 m)   Wt 142 lb 3.2 oz (64.5 kg)   LMP 06/29/2022   BMI 24.41 kg/m   General Appearance:    Alert,  cooperative, no distress, appears stated age  Head:    Normocephalic, without obvious abnormality, atraumatic  Eyes:    PERRL, conjunctiva/corneas clear, EOM's intact, fundi    benign  Ears:    Normal TM's and external ear canals  Nose:   Nares normal, mucosa normal, no drainage or sinus   tenderness  Throat:   Lips, mucosa, and tongue normal; teeth and gums normal  Neck:   Supple, no lymphadenopathy;  thyroid:  no enlargement/ tenderness/nodules; no carotid bruit or JVD  Back:    Spine nontender, no curvature, ROM normal, no CVA     tenderness  Lungs:     Clear to auscultation bilaterally without wheezes, rales or     ronchi;  respirations unlabored  Chest Wall:    No tenderness or deformity   Heart:    Regular rate and rhythm, S1 and S2 normal, no murmur, rub or gallop  Breast Exam:    No tenderness, masses, or nipple discharge or inversion.      No axillary lymphadenopathy  Abdomen:     Soft, non-tender, nondistended, normoactive bowel sounds,    no masses, no hepatosplenomegaly  Genitalia:    Normal external genitalia without lesions. Two very small inclusion cysts (appears white, one on each side, at posterior labia majora). Nontender.  BUS and vagina normal; cervix without lesions, or cervical motion tenderness. No abnormal vaginal discharge.  Uterus and adnexa not enlarged, nontender, no masses.  Pap performed  Rectal:    Not performed due to age<40 and no related complaints  Extremities:   No clubbing, cyanosis or edema  Pulses:   2+ and symmetric all extremities  Skin:   Skin color, texture, turgor normal, no rashes or lesions  Lymph nodes:   Cervical, supraclavicular, inguinal and axillary nodes normal  Neurologic:   CNII-XII intact, normal strength, sensation and gait; reflexes 2+ and symmetric throughout          Psych:   Normal mood, affect, hygiene and grooming.       07/15/2022   10:42 AM 12/31/2021   11:17 AM 12/20/2017    3:41 PM  Depression screen PHQ 2/9  Decreased Interest 2 2 0  Down, Depressed, Hopeless 2 0 1  PHQ - 2 Score $Remov'4 2 1  'rGvxaV$ Altered sleeping 1 3 0  Tired, decreased energy 2 3 0  Change in appetite 0 0 1  Feeling bad or failure about yourself  0 0 1  Trouble concentrating 2 1 0  Moving slowly or fidgety/restless 0 0 0  Suicidal thoughts 0  0  PHQ-9 Score $RemoveBef'9 9 3  'uVkFaymcrb$ Difficult doing work/chores Somewhat difficult  Not difficult at all      07/15/2022   10:41 AM 12/31/2021   11:17 AM 12/20/2017    3:41 PM  GAD 7 : Generalized Anxiety Score  Nervous, Anxious, on Edge $Remov'1 1 1  'PpDpfT$ Control/stop worrying 0 0 0  Worry too much - different things 1 0 1  Trouble relaxing 0 0 0  Restless 0 0 0   Easily annoyed or irritable 0 0 0  Afraid - awful might happen 0 0 1  Total GAD 7 Score $Remov'2 1 3  'oBkzgW$ Anxiety Difficulty Not difficult at all  Not difficult at all    ASSESSMENT/PLAN:  Annual physical exam - Plan: POCT Urinalysis DIP (Proadvantage Device), TSH, CBC with Differential/Platelet, Comprehensive metabolic panel, Lipid panel, Hepatitis B surface antigen, Hepatitis B surface antibody,qualitative, Hepatitis C antibody, RPR, HIV Antibody (routine testing  w rflx), Cytology - PAP([facility])  Hypothyroidism, unspecified type - Plan: TSH  Vitamin D deficiency  Need for tetanus booster - Plan: Tdap vaccine greater than or equal to 7yo IM  Need for influenza vaccination - Plan: Flu Vaccine QUAD 6+ mos PF IM (Fluarix Quad PF)  Screen for STD (sexually transmitted disease) - Plan: Hepatitis B surface antigen, Hepatitis C antibody, RPR, HIV Antibody (routine testing w rflx)  Screening for viral disease - Plan: Hepatitis B surface antigen, Hepatitis B surface antibody,qualitative  Use of nicotine containing substance in combustion-free vaporization device - counseled and encouraged cessation  Engages in binge consumption of alcohol - reviewed safe alcohol recommendations, risks of excessive intake  Anxiety and depression - overall well controlled with lexapro $RemoveBefo'10mg'yroGwBfIsQD$ , cont.  Consider counseling if moods not improving. - Plan: escitalopram (LEXAPRO) 10 MG tablet   Discussed monthly self breast exams; at least 30 minutes of aerobic activity at least 5 days/week, weight-bearing exercise at least 2x/week; proper sunscreen use reviewed; healthy diet, including goals of calcium and vitamin D intake and alcohol recommendations (less than or equal to 1 drink/day) reviewed; Encouraged cessation of vaping; regular condom use encouraged; encouraged regular seatbelt use. Immunization recommendations discussed--flu and Tdap given. Recommended COVID booster in 2 weeks.  Discussed Heplisav-B if not immune to  Hep B on labs.  Pt encouraged to get PCP in [place], where she now lives. If not, f/u 1 year, sooner prn.

## 2022-07-15 ENCOUNTER — Encounter: Payer: Self-pay | Admitting: Family Medicine

## 2022-07-15 ENCOUNTER — Ambulatory Visit (INDEPENDENT_AMBULATORY_CARE_PROVIDER_SITE_OTHER): Payer: BC Managed Care – PPO | Admitting: Family Medicine

## 2022-07-15 ENCOUNTER — Other Ambulatory Visit (HOSPITAL_COMMUNITY)
Admission: RE | Admit: 2022-07-15 | Discharge: 2022-07-15 | Disposition: A | Discharge status: Home / Self Care | Payer: BC Managed Care – PPO | Source: Ambulatory Visit | Attending: Family Medicine | Admitting: Family Medicine

## 2022-07-15 VITALS — BP 110/72 | HR 68 | Ht 64.0 in | Wt 142.2 lb

## 2022-07-15 DIAGNOSIS — Z23 Encounter for immunization: Secondary | ICD-10-CM

## 2022-07-15 DIAGNOSIS — Z1159 Encounter for screening for other viral diseases: Secondary | ICD-10-CM | POA: Diagnosis not present

## 2022-07-15 DIAGNOSIS — E559 Vitamin D deficiency, unspecified: Secondary | ICD-10-CM | POA: Diagnosis not present

## 2022-07-15 DIAGNOSIS — F1010 Alcohol abuse, uncomplicated: Secondary | ICD-10-CM

## 2022-07-15 DIAGNOSIS — Z0000 Encounter for general adult medical examination without abnormal findings: Secondary | ICD-10-CM | POA: Diagnosis not present

## 2022-07-15 DIAGNOSIS — F32A Depression, unspecified: Secondary | ICD-10-CM

## 2022-07-15 DIAGNOSIS — Z720 Tobacco use: Secondary | ICD-10-CM

## 2022-07-15 DIAGNOSIS — E039 Hypothyroidism, unspecified: Secondary | ICD-10-CM

## 2022-07-15 DIAGNOSIS — Z113 Encounter for screening for infections with a predominantly sexual mode of transmission: Secondary | ICD-10-CM

## 2022-07-15 DIAGNOSIS — F419 Anxiety disorder, unspecified: Secondary | ICD-10-CM

## 2022-07-15 LAB — POCT URINALYSIS DIP (PROADVANTAGE DEVICE)
Bilirubin, UA: NEGATIVE
Blood, UA: NEGATIVE
Glucose, UA: NEGATIVE mg/dL
Ketones, POC UA: NEGATIVE mg/dL
Leukocytes, UA: NEGATIVE
Nitrite, UA: NEGATIVE
Protein Ur, POC: NEGATIVE mg/dL
Specific Gravity, Urine: 1.005
Urobilinogen, Ur: NEGATIVE
pH, UA: 6 (ref 5.0–8.0)

## 2022-07-15 MED ORDER — ESCITALOPRAM OXALATE 10 MG PO TABS: 10.0000 mg | ORAL_TABLET | Freq: Every day | ORAL | 3 refills | Status: DC

## 2022-07-15 NOTE — Patient Instructions (Addendum)
I encourage you to take vitamin D more regularly (you can double up if you miss pills).  I recommend COVID booster--wait 2 weeks from today's vaccines.

## 2022-07-16 LAB — COMPREHENSIVE METABOLIC PANEL
ALT: 9 IU/L (ref 0–32)
AST: 16 IU/L (ref 0–40)
Albumin/Globulin Ratio: 1.8 (ref 1.2–2.2)
Albumin: 4.7 g/dL (ref 4.0–5.0)
Alkaline Phosphatase: 53 IU/L (ref 44–121)
BUN/Creatinine Ratio: 16 (ref 9–23)
BUN: 11 mg/dL (ref 6–20)
Bilirubin Total: 0.4 mg/dL (ref 0.0–1.2)
CO2: 20 mmol/L (ref 20–29)
Calcium: 9.6 mg/dL (ref 8.7–10.2)
Chloride: 102 mmol/L (ref 96–106)
Creatinine, Ser: 0.68 mg/dL (ref 0.57–1.00)
Globulin, Total: 2.6 g/dL (ref 1.5–4.5)
Glucose: 76 mg/dL (ref 70–99)
Potassium: 4.4 mmol/L (ref 3.5–5.2)
Sodium: 139 mmol/L (ref 134–144)
Total Protein: 7.3 g/dL (ref 6.0–8.5)
eGFR: 126 mL/min/{1.73_m2} (ref >59)

## 2022-07-16 LAB — CBC WITH DIFFERENTIAL/PLATELET
Basophils Absolute: 0.1 10*3/uL (ref 0.0–0.2)
Basos: 1 %
EOS (ABSOLUTE): 0.2 10*3/uL (ref 0.0–0.4)
Eos: 2 %
Hematocrit: 43 % (ref 34.0–46.6)
Hemoglobin: 13.4 g/dL (ref 11.1–15.9)
Immature Grans (Abs): 0 10*3/uL (ref 0.0–0.1)
Immature Granulocytes: 0 %
Lymphocytes Absolute: 1.7 10*3/uL (ref 0.7–3.1)
Lymphs: 20 %
MCH: 27.5 pg (ref 26.6–33.0)
MCHC: 31.2 g/dL — ABNORMAL LOW (ref 31.5–35.7)
MCV: 88 fL (ref 79–97)
Monocytes Absolute: 0.3 10*3/uL (ref 0.1–0.9)
Monocytes: 4 %
Neutrophils Absolute: 6.1 10*3/uL (ref 1.4–7.0)
Neutrophils: 73 %
Platelets: 356 10*3/uL (ref 150–450)
RBC: 4.87 x10E6/uL (ref 3.77–5.28)
RDW: 11.9 % (ref 11.7–15.4)
WBC: 8.3 10*3/uL (ref 3.4–10.8)

## 2022-07-16 LAB — RPR: RPR Ser Ql: NONREACTIVE

## 2022-07-16 LAB — HIV ANTIBODY (ROUTINE TESTING W REFLEX): HIV Screen 4th Generation wRfx: NONREACTIVE

## 2022-07-16 LAB — LIPID PANEL
Chol/HDL Ratio: 2.9 ratio (ref 0.0–4.4)
Cholesterol, Total: 202 mg/dL — ABNORMAL HIGH (ref 100–199)
HDL: 70 mg/dL (ref >39)
LDL Chol Calc (NIH): 118 mg/dL — ABNORMAL HIGH (ref 0–99)
Triglycerides: 80 mg/dL (ref 0–149)
VLDL Cholesterol Cal: 14 mg/dL (ref 5–40)

## 2022-07-16 LAB — TSH: TSH: 1.47 u[IU]/mL (ref 0.450–4.500)

## 2022-07-16 LAB — HEPATITIS B SURFACE ANTIBODY,QUALITATIVE: Hep B Surface Ab, Qual: NONREACTIVE

## 2022-07-16 LAB — HEPATITIS C ANTIBODY: Hep C Virus Ab: NONREACTIVE

## 2022-07-16 LAB — HEPATITIS B SURFACE ANTIGEN: Hepatitis B Surface Ag: NEGATIVE

## 2022-07-17 ENCOUNTER — Other Ambulatory Visit: Payer: Self-pay | Admitting: Family Medicine

## 2022-07-18 MED ORDER — LEVOTHYROXINE SODIUM 88 MCG PO TABS: 88.0000 ug | ORAL_TABLET | Freq: Every day | ORAL | 3 refills | Status: DC

## 2022-07-22 ENCOUNTER — Encounter: Payer: Self-pay | Admitting: Family Medicine

## 2022-07-29 LAB — CYTOLOGY - PAP
Comment: NEGATIVE
Comment: NEGATIVE
Comment: NEGATIVE
HPV 16: NEGATIVE
HPV 18 / 45: NEGATIVE
High risk HPV: POSITIVE — AB

## 2022-07-29 NOTE — Telephone Encounter (Signed)
Please check with pt and/or her pharmacy Has she ever gotten pills filled from that [facility]?  Looks like her prior MD wrote for a year supply in 02/2022, sent to the local [facility]. Not sure what the issue is, but okay to give a refill, if needed (unsure if pharmacy just needs for 1 pack, or needs a whole new rx).

## 2022-10-08 ENCOUNTER — Encounter: Payer: Self-pay | Admitting: Family Medicine

## 2022-10-08 ENCOUNTER — Other Ambulatory Visit: Payer: Self-pay | Admitting: *Deleted

## 2022-10-08 MED ORDER — NORGESTIMATE-ETH ESTRADIOL 0.25-35 MG-MCG PO TABS: 1.0000 | ORAL_TABLET | Freq: Every day | ORAL | 1 refills | Status: DC

## 2023-03-31 ENCOUNTER — Other Ambulatory Visit: Payer: Self-pay | Admitting: Family Medicine

## 2023-03-31 NOTE — Telephone Encounter (Signed)
Ok to send in 90d RF (last CPE was 06/2022). She doesn't have a CPE scheduled. She had abnormal pap smear, and is due for repeat pap in 06/2023.   She also should get HepliSav-B.  Unsure if she plans to find a doc in [place], or if will come home for this.  She likely will read MyChart messages, if you aren't able to reach her by phone.

## 2023-04-01 ENCOUNTER — Encounter: Payer: Self-pay | Admitting: *Deleted

## 2023-04-01 NOTE — Telephone Encounter (Signed)
Called patient and sent MyChart message

## 2023-07-31 ENCOUNTER — Other Ambulatory Visit: Payer: Self-pay | Admitting: Family Medicine

## 2023-07-31 DIAGNOSIS — F32A Depression, unspecified: Secondary | ICD-10-CM

## 2023-08-01 ENCOUNTER — Encounter: Payer: Self-pay | Admitting: *Deleted

## 2023-08-30 ENCOUNTER — Other Ambulatory Visit: Payer: Self-pay | Admitting: Family Medicine

## 2023-08-30 DIAGNOSIS — F32A Depression, unspecified: Secondary | ICD-10-CM

## 2023-09-08 ENCOUNTER — Other Ambulatory Visit: Payer: Self-pay | Admitting: Family Medicine

## 2023-09-08 DIAGNOSIS — F32A Depression, unspecified: Secondary | ICD-10-CM

## 2023-09-08 DIAGNOSIS — F419 Anxiety disorder, unspecified: Secondary | ICD-10-CM

## 2023-09-30 ENCOUNTER — Other Ambulatory Visit: Payer: Self-pay | Admitting: Family Medicine

## 2023-10-04 ENCOUNTER — Other Ambulatory Visit: Payer: Self-pay | Admitting: Family Medicine
